# Patient Record
Sex: Male | Born: 1995 | Race: White | Hispanic: No | Marital: Single | Smoking: Current every day smoker
Health system: Southern US, Community
[De-identification: ages and names within clinical notes are randomized; demographics above are authoritative.]

## PROBLEM LIST (undated history)

## (undated) DIAGNOSIS — T7840XA Allergy, unspecified, initial encounter: Secondary | ICD-10-CM

---
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
## 2022-02-09 ENCOUNTER — Telehealth (HOSPITAL_COMMUNITY): Payer: Self-pay

## 2022-02-09 NOTE — Telephone Encounter (Signed)
[facility] to f/u for medication management. Pt to bring paperwork and meds list and come as a walk-in M, W,TH, FR.

## 2023-06-13 ENCOUNTER — Other Ambulatory Visit: Payer: Self-pay

## 2023-06-13 ENCOUNTER — Encounter (HOSPITAL_COMMUNITY): Payer: Self-pay | Admitting: Emergency Medicine

## 2023-06-13 ENCOUNTER — Emergency Department (HOSPITAL_COMMUNITY)
Admission: EM | Admit: 2023-06-13 | Discharge: 2023-06-13 | Disposition: A | Discharge status: Home / Self Care | Payer: Medicaid Other | Attending: Emergency Medicine | Admitting: Emergency Medicine

## 2023-06-13 DIAGNOSIS — E871 Hypo-osmolality and hyponatremia: Secondary | ICD-10-CM | POA: Insufficient documentation

## 2023-06-13 DIAGNOSIS — Z5900 Homelessness unspecified: Secondary | ICD-10-CM | POA: Diagnosis not present

## 2023-06-13 DIAGNOSIS — D72829 Elevated white blood cell count, unspecified: Secondary | ICD-10-CM | POA: Insufficient documentation

## 2023-06-13 DIAGNOSIS — L03115 Cellulitis of right lower limb: Secondary | ICD-10-CM | POA: Insufficient documentation

## 2023-06-13 DIAGNOSIS — R7401 Elevation of levels of liver transaminase levels: Secondary | ICD-10-CM | POA: Insufficient documentation

## 2023-06-13 DIAGNOSIS — L089 Local infection of the skin and subcutaneous tissue, unspecified: Secondary | ICD-10-CM | POA: Diagnosis present

## 2023-06-13 LAB — COMPREHENSIVE METABOLIC PANEL
ALT: 64 U/L — ABNORMAL HIGH (ref 0–44)
AST: 98 U/L — ABNORMAL HIGH (ref 15–41)
Albumin: 4.1 g/dL (ref 3.5–5.0)
Alkaline Phosphatase: 54 U/L (ref 38–126)
Anion gap: 9 (ref 5–15)
BUN: 12 mg/dL (ref 6–20)
CO2: 23 mmol/L (ref 22–32)
Calcium: 8.8 mg/dL — ABNORMAL LOW (ref 8.9–10.3)
Chloride: 100 mmol/L (ref 98–111)
Creatinine, Ser: 0.9 mg/dL (ref 0.61–1.24)
GFR, Estimated: >60 mL/min (ref >60)
Glucose, Bld: 100 mg/dL — ABNORMAL HIGH (ref 70–99)
Potassium: 3.5 mmol/L (ref 3.5–5.1)
Sodium: 132 mmol/L — ABNORMAL LOW (ref 135–145)
Total Bilirubin: 0.6 mg/dL (ref 0.3–1.2)
Total Protein: 7.3 g/dL (ref 6.5–8.1)

## 2023-06-13 LAB — CBC WITH DIFFERENTIAL/PLATELET
Abs Immature Granulocytes: 0.03 10*3/uL (ref 0.00–0.07)
Basophils Absolute: 0.1 10*3/uL (ref 0.0–0.1)
Basophils Relative: 1 %
Eosinophils Absolute: 0.2 10*3/uL (ref 0.0–0.5)
Eosinophils Relative: 2 %
HCT: 42.3 % (ref 39.0–52.0)
Hemoglobin: 14.6 g/dL (ref 13.0–17.0)
Immature Granulocytes: 0 %
Lymphocytes Relative: 32 %
Lymphs Abs: 3.7 10*3/uL (ref 0.7–4.0)
MCH: 30.7 pg (ref 26.0–34.0)
MCHC: 34.5 g/dL (ref 30.0–36.0)
MCV: 88.9 fL (ref 80.0–100.0)
Monocytes Absolute: 0.8 10*3/uL (ref 0.1–1.0)
Monocytes Relative: 7 %
Neutro Abs: 6.8 10*3/uL (ref 1.7–7.7)
Neutrophils Relative %: 58 %
Platelets: 325 10*3/uL (ref 150–400)
RBC: 4.76 MIL/uL (ref 4.22–5.81)
RDW: 11.7 % (ref 11.5–15.5)
WBC: 11.6 10*3/uL — ABNORMAL HIGH (ref 4.0–10.5)
nRBC: 0 % (ref 0.0–0.2)

## 2023-06-13 LAB — I-STAT CG4 LACTIC ACID, ED
Lactic Acid, Venous: 0.7 mmol/L (ref 0.5–1.9)
Lactic Acid, Venous: 0.8 mmol/L (ref 0.5–1.9)

## 2023-06-13 MED ORDER — CLINDAMYCIN HCL 150 MG PO CAPS: 450.0000 mg | ORAL_CAPSULE | Freq: Three times a day (TID) | ORAL | 0 refills | Status: AC

## 2023-06-13 MED ORDER — ONDANSETRON HCL 4 MG PO TABS: 4.0000 mg | ORAL_TABLET | Freq: Three times a day (TID) | ORAL | 0 refills | Status: AC | PRN

## 2023-06-13 MED ORDER — CLINDAMYCIN PHOSPHATE 600 MG/50ML IV SOLN
600.0000 mg | Freq: Once | INTRAVENOUS | Status: AC
  Administered 2023-06-13: 600 mg via INTRAVENOUS
  Filled 2023-06-13: qty 50

## 2023-06-13 NOTE — ED Notes (Signed)
Bag lunch and water given.

## 2023-06-13 NOTE — ED Triage Notes (Signed)
[facility] in from [facility] to cellulitis of right lower leg. On July 10th pt was prescribed antibiotic for abscess on right ankle and was only able to take medication for 4 of the 14 days before medication went missing. Pt states pain and swelling is worse and spreading up right leg. Denies fevers.

## 2023-06-13 NOTE — Discharge Instructions (Addendum)
Your labs are reassuring. We do not see any signs of severe infection. We gave you a dose of IV antibiotics to help treat the infection to your leg. I am discharging you on the same medication as well as nausea medication if needed. You should complete the entire course of these antibiotics even if symptoms are improving. Be rechecked in 48-72 hours if symptoms are not improving or sooner if symptoms worsen or you develop fever, chest pain, or other worsening condition.
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
